# Patient Record
Sex: Male | Born: 2000 | Race: Black or African American | Hispanic: No | Marital: Single | Smoking: Never smoker
Health system: Southern US, Community
[De-identification: ages and names within clinical notes are randomized; demographics above are authoritative.]

## PROBLEM LIST (undated history)

## (undated) HISTORY — PX: OTHER SURGICAL HISTORY: SHX169

---
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
## 2021-02-13 ENCOUNTER — Other Ambulatory Visit: Payer: Self-pay

## 2021-02-13 ENCOUNTER — Encounter (HOSPITAL_BASED_OUTPATIENT_CLINIC_OR_DEPARTMENT_OTHER): Payer: Self-pay

## 2021-02-13 ENCOUNTER — Emergency Department (HOSPITAL_BASED_OUTPATIENT_CLINIC_OR_DEPARTMENT_OTHER)
Admission: EM | Admit: 2021-02-13 | Discharge: 2021-02-13 | Disposition: A | Discharge status: Home / Self Care | Attending: Emergency Medicine | Admitting: Emergency Medicine

## 2021-02-13 ENCOUNTER — Other Ambulatory Visit (HOSPITAL_BASED_OUTPATIENT_CLINIC_OR_DEPARTMENT_OTHER): Payer: Self-pay

## 2021-02-13 DIAGNOSIS — H02845 Edema of left lower eyelid: Secondary | ICD-10-CM | POA: Diagnosis not present

## 2021-02-13 DIAGNOSIS — R600 Localized edema: Secondary | ICD-10-CM

## 2021-02-13 DIAGNOSIS — R220 Localized swelling, mass and lump, head: Secondary | ICD-10-CM

## 2021-02-13 MED ORDER — PREDNISONE 50 MG PO TABS
60.0000 mg | ORAL_TABLET | Freq: Once | ORAL | Status: AC
  Administered 2021-02-13: 60 mg via ORAL
  Filled 2021-02-13: qty 1

## 2021-02-13 MED ORDER — DIPHENHYDRAMINE HCL 25 MG PO TABS
25.0000 mg | ORAL_TABLET | Freq: Four times a day (QID) | ORAL | 0 refills | Status: AC | PRN
  Filled 2021-02-13: qty 100, 25d supply, fill #0

## 2021-02-13 MED ORDER — PREDNISONE 10 MG PO TABS
ORAL_TABLET | ORAL | 0 refills | Status: AC
  Filled 2021-02-13: qty 21, 6d supply, fill #0

## 2021-02-13 NOTE — ED Triage Notes (Signed)
Pt c/o swelling below left eye started yesterday-denies injury-NAD-steady gait

## 2021-02-13 NOTE — ED Provider Notes (Signed)
[facility] EMERGENCY DEPARTMENT Provider Note  CSN: [id number] Arrival date & time: 02/13/21 1239    History Chief Complaint  Patient presents with   Facial Swelling    Chase Rose is a 20 y.o. male with no significant PMH reports he has had some swelling under his L eye since yesterday. Worsened after he put some rubbing alcohol on it but improved after applying an ice pack. No injuries. No eye pain, no blurry vision. No new soaps, lotions, detergents, etc.    History reviewed. No pertinent past medical history.  Past Surgical History:  Procedure Laterality Date   INNER EAR SURGERY     tubes in his ears      No family history on file.  Social History   Tobacco Use   Smoking status: Never   Smokeless tobacco: Never  Substance Use Topics   Alcohol use: Yes    Comment: occ   Drug use: Never     Home Medications Prior to Admission medications   Medication Sig Start Date End Date Taking? Authorizing Provider  diphenhydrAMINE (BENADRYL) 25 MG tablet Take 1 tablet (25 mg total) by mouth every 6 (six) hours as needed. 02/13/21  Yes Pollyann Savoy, MD  predniSONE (STERAPRED UNI-PAK 21 TAB) 10 MG (21) TBPK tablet 10mg  Tabs, 6 day taper. Use as directed 02/13/21  Yes 02/15/21, MD     Allergies    Patient has no known allergies.   Review of Systems   Review of Systems A comprehensive review of systems was completed and negative except as noted in HPI.    Physical Exam BP (!) 151/92   Pulse 77   Temp 98.2 F (36.8 C) (Oral)   Resp 18   Ht 5\' 11"  (1.803 m)   Wt 107 kg   SpO2 99%   BMI 32.92 kg/m   Physical Exam Vitals and nursing note reviewed.  Constitutional:      Appearance: Normal appearance.  HENT:     Head: Normocephalic and atraumatic.     Nose: Nose normal.     Mouth/Throat:     Mouth: Mucous membranes are moist.  Eyes:     Extraocular Movements: Extraocular movements intact.     Conjunctiva/sclera: Conjunctivae  normal.     Pupils: Pupils are equal, round, and reactive to light.     Comments: Mild periorbital edema under L eye, no signs of infection. No pain with eye movement  Cardiovascular:     Rate and Rhythm: Normal rate.  Pulmonary:     Effort: Pulmonary effort is normal.     Breath sounds: Normal breath sounds.  Abdominal:     General: Abdomen is flat.     Palpations: Abdomen is soft.     Tenderness: There is no abdominal tenderness.  Musculoskeletal:        General: No swelling. Normal range of motion.     Cervical back: Neck supple.  Skin:    General: Skin is warm and dry.  Neurological:     General: No focal deficit present.     Mental Status: He is alert.  Psychiatric:        Mood and Affect: Mood normal.      ED Results / Procedures / Treatments   Labs (all labs ordered are listed, but only abnormal results are displayed) Labs Reviewed - No data to display  EKG None   Radiology No results found.  Procedures Procedures  Medications Ordered in the  ED Medications  predniSONE (DELTASONE) tablet 60 mg (60 mg Oral Given 02/13/21 1322)     MDM Rules/Calculators/A&P MDM Mild periorbital edema, likely allergic, will Rx steroids, benadryl. Advised to return for worsening swelling, pain, fever, blurry vision or any other concerns.   ED Course  I have reviewed the triage vital signs and the nursing notes.  Pertinent labs & imaging results that were available during my care of the patient were reviewed by me and considered in my medical decision making (see chart for details).     Final Clinical Impression(s) / ED Diagnoses Final diagnoses:  Left facial swelling  Periorbital edema of left eye    Rx / DC Orders ED Discharge Orders          Ordered    predniSONE (STERAPRED UNI-PAK 21 TAB) 10 MG (21) TBPK tablet        02/13/21 1323    diphenhydrAMINE (BENADRYL) 25 MG tablet  Every 6 hours PRN        02/13/21 1323             Pollyann Savoy,  MD 02/13/21 1323

## 2022-09-22 ENCOUNTER — Other Ambulatory Visit (HOSPITAL_COMMUNITY): Payer: Self-pay
